# Patient Record
Sex: Female | Born: 1975 | Race: Asian | Hispanic: No | Marital: Married | Smoking: Never smoker
Health system: Southern US, Community
[De-identification: ages and names within clinical notes are randomized; demographics above are authoritative.]

## PROBLEM LIST (undated history)

## (undated) DIAGNOSIS — R21 Rash and other nonspecific skin eruption: Secondary | ICD-10-CM

## (undated) DIAGNOSIS — T783XXA Angioneurotic edema, initial encounter: Secondary | ICD-10-CM

## (undated) HISTORY — PX: WISDOM TOOTH EXTRACTION: SHX21

## (undated) HISTORY — DX: Angioneurotic edema, initial encounter: T78.3XXA

## (undated) HISTORY — DX: Rash and other nonspecific skin eruption: R21

## (undated) HISTORY — PX: OTHER SURGICAL HISTORY: SHX169

---

## 2006-11-15 ENCOUNTER — Ambulatory Visit (HOSPITAL_COMMUNITY): Admission: RE | Admit: 2006-11-15 | Discharge: 2006-11-15 | Payer: Self-pay | Admitting: Obstetrics and Gynecology

## 2010-10-05 ENCOUNTER — Other Ambulatory Visit: Payer: Self-pay | Admitting: Obstetrics and Gynecology

## 2010-10-05 ENCOUNTER — Other Ambulatory Visit (HOSPITAL_COMMUNITY)
Admission: RE | Admit: 2010-10-05 | Discharge: 2010-10-05 | Disposition: A | Discharge status: Home / Self Care | Payer: 59 | Source: Ambulatory Visit | Attending: Obstetrics and Gynecology | Admitting: Obstetrics and Gynecology

## 2010-10-05 DIAGNOSIS — Z01419 Encounter for gynecological examination (general) (routine) without abnormal findings: Secondary | ICD-10-CM | POA: Insufficient documentation

## 2010-10-05 DIAGNOSIS — Z1159 Encounter for screening for other viral diseases: Secondary | ICD-10-CM | POA: Insufficient documentation

## 2011-11-02 ENCOUNTER — Other Ambulatory Visit (HOSPITAL_COMMUNITY)
Admission: RE | Admit: 2011-11-02 | Discharge: 2011-11-02 | Disposition: A | Discharge status: Home / Self Care | Payer: BC Managed Care – PPO | Source: Ambulatory Visit | Attending: Obstetrics and Gynecology | Admitting: Obstetrics and Gynecology

## 2011-11-02 ENCOUNTER — Other Ambulatory Visit: Payer: Self-pay | Admitting: Obstetrics and Gynecology

## 2011-11-02 DIAGNOSIS — Z124 Encounter for screening for malignant neoplasm of cervix: Secondary | ICD-10-CM | POA: Insufficient documentation

## 2017-07-27 ENCOUNTER — Ambulatory Visit: Payer: BLUE CROSS/BLUE SHIELD | Admitting: Allergy and Immunology

## 2017-07-27 ENCOUNTER — Encounter: Payer: Self-pay | Admitting: Allergy and Immunology

## 2017-07-27 VITALS — BP 120/84 | HR 72 | Temp 98.0°F | Resp 16 | Ht 61.0 in | Wt 136.5 lb

## 2017-07-27 DIAGNOSIS — H1013 Acute atopic conjunctivitis, bilateral: Secondary | ICD-10-CM | POA: Diagnosis not present

## 2017-07-27 DIAGNOSIS — J3089 Other allergic rhinitis: Secondary | ICD-10-CM

## 2017-07-27 DIAGNOSIS — H1010 Acute atopic conjunctivitis, unspecified eye: Secondary | ICD-10-CM | POA: Insufficient documentation

## 2017-07-27 DIAGNOSIS — T783XXD Angioneurotic edema, subsequent encounter: Secondary | ICD-10-CM

## 2017-07-27 DIAGNOSIS — T783XXA Angioneurotic edema, initial encounter: Secondary | ICD-10-CM | POA: Insufficient documentation

## 2017-07-27 DIAGNOSIS — L500 Allergic urticaria: Secondary | ICD-10-CM | POA: Diagnosis not present

## 2017-07-27 MED ORDER — LEVOCETIRIZINE DIHYDROCHLORIDE 5 MG PO TABS: 5.0000 mg | ORAL_TABLET | Freq: Every evening | ORAL | 5 refills | Status: AC

## 2017-07-27 MED ORDER — OLOPATADINE HCL 0.7 % OP SOLN: 1.0000 [drp] | Freq: Every day | OPHTHALMIC | 5 refills | Status: DC

## 2017-07-27 MED ORDER — OLOPATADINE HCL 0.2 % OP SOLN: 1.0000 [drp] | OPHTHALMIC | 5 refills | Status: AC

## 2017-07-27 MED ORDER — AZELASTINE HCL 0.15 % NA SOLN: 2.0000 | Freq: Two times a day (BID) | NASAL | 5 refills | Status: AC

## 2017-07-27 NOTE — Assessment & Plan Note (Addendum)
Unclear etiology.  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  Food allergen skin testing was negative today despite a positive histamine control with the exception of borderline positive results to tree nut, though she is able to consume tree nuts without symptoms and her recent symptoms do not correlate with tree nut consumption.  Therefore, these likey represent false positive results which is not uncommon for food allergy testing. The patient is not taking an ACE inhibitor and has no known family history of angioedema.. NSAIDs may exacerbate angioedema but in this case are not the underlying etiology as demonstrated by the fact that the patient has experienced angioedema in the absence of NSAIDs. There are no concomitant symptoms concerning for anaphylaxis or constitutional symptoms worrisome for an underlying malignancy. We will order labs to rule out hereditary angioedema, acquired angioedema, urticaria associated angioedema, and other potential etiologies. For symptom relief, patient is to take oral antihistamines as directed. However, if the underlying pathophysiology is bradykinin mediated, antihistamines will not reduce symptoms.  The following labs have been ordered: Antithyroglobulin antibody, thyroid peroxidase antibody, FCeRI antibody, tryptase, C4, C1 esterase inhibitor (quantitative and functional), C1q, factor XII, CBC, CMP, and galactose-alpha-1,3-galactose IgE level.  The patient will be notified with further recommendations after lab results have returned.  Should symptoms recur, a  journal is to be kept recording any foods eaten, beverages consumed, medications taken, activities performed, and environmental conditions within a 6 hour period prior to the onset of symptoms. For any symptoms concerning for anaphylaxis, 911 is to be called immediately.

## 2017-07-27 NOTE — Assessment & Plan Note (Signed)
   Treatment plan as outlined above for allergic rhinitis.  A prescription has been provided for Pazeo, one drop per eye daily as needed.  I have also recommended eye lubricant drops (i.e., Natural Tears) as needed. 

## 2017-07-27 NOTE — Patient Instructions (Addendum)
Angioedema Unclear etiology.  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  Food allergen skin testing was negative today despite a positive histamine control with the exception of borderline positive results to tree nut, though she is able to consume tree nuts without symptoms and her recent symptoms do not correlate with tree nut consumption.  Therefore, these likey represent false positive results which is not uncommon for food allergy testing. The patient is not taking an ACE inhibitor and has no known family history of angioedema.. NSAIDs may exacerbate angioedema but in this case are not the underlying etiology as demonstrated by the fact that the patient has experienced angioedema in the absence of NSAIDs. There are no concomitant symptoms concerning for anaphylaxis or constitutional symptoms worrisome for an underlying malignancy. We will order labs to rule out hereditary angioedema, acquired angioedema, urticaria associated angioedema, and other potential etiologies. For symptom relief, patient is to take oral antihistamines as directed. However, if the underlying pathophysiology is bradykinin mediated, antihistamines will not reduce symptoms.  The following labs have been ordered: Antithyroglobulin antibody, thyroid peroxidase antibody, FCeRI antibody, tryptase, C4, C1 esterase inhibitor (quantitative and functional), C1q, factor XII, CBC, CMP, and galactose-alpha-1,3-galactose IgE level.  The patient will be notified with further recommendations after lab results have returned.  Should symptoms recur, a  journal is to be kept recording any foods eaten, beverages consumed, medications taken, activities performed, and environmental conditions within a 6 hour period prior to the onset of symptoms. For any symptoms concerning for anaphylaxis, 911 is to be called immediately.  Seasonal  allergic rhinitis  Aeroallergen avoidance measures have been discussed and provided in written form.  A prescription has been provided for levocetirizine, 5 mg daily as needed.  A prescription has been provided for azelastine nasal spray, one spray per nostril 1-2 times daily as needed. Proper nasal spray technique has been discussed and demonstrated.  Nasal saline spray (i.e., Simply Saline) or nasal saline lavage (i.e., NeilMed) is recommended as needed and prior to medicated nasal sprays.  If allergen avoidance measures and medications fail to adequately relieve symptoms, aeroallergen immunotherapy will be considered.  Allergic conjunctivitis  Treatment plan as outlined above for allergic rhinitis.  A prescription has been provided for Pazeo, one drop per eye daily as needed.  I have also recommended eye lubricant drops (i.e., Natural Tears) as needed.   When lab results have returned the patient will be called with further recommendations and follow up instructions.  Reducing Pollen Exposure  The [employer] Academy of [employer] suggests the following steps to reduce your exposure to pollen during allergy seasons.    1. Do not hang sheets or clothing out to dry; pollen may collect on these items. 2. Do not mow lawns or spend time around freshly cut grass; mowing stirs up pollen. 3. Keep windows closed at night.  Keep car windows closed while driving. 4. Minimize morning activities outdoors, a time when pollen counts are usually at their highest. 5. Stay indoors as much as possible when pollen counts or humidity is high and on windy days when pollen tends to remain in the air longer. 6. Use air conditioning when possible.  Many air conditioners have filters that trap the pollen spores. 7. Use a HEPA room air filter to remove pollen form the indoor air you breathe.

## 2017-07-27 NOTE — Progress Notes (Signed)
New Patient Note  RE: Yolanda Elliott MRN: [id number] DOB: 1975-06-06 Date of Office Visit: 07/27/2017  Referring provider: Cipriano Bunker, MD Primary care provider: Cipriano Bunker, MD  Chief Complaint: Angioedema and Rash   History of present illness: Yolanda Elliott is a 42 y.o. female seen today in consultation requested by Cipriano Bunker, MD.  She reports that over the past month she has had recurrent episodes of swelling of her face, particularly around the eyes.  These episodes occur approximately every 10 days and the swelling lasts for approximately 7 days.  She denies concomitant urticaria, cardiopulmonary symptoms, or GI symptoms.  No specific medication, food, skin care product, detergent, soap, or other environmental triggers have been identified.  She has no known family history of angioedema and has not been on an ACE inhibitor.  She went to the urgent care on one occasion and was treated with a corticosteroid injection and prednisone taper, however the systemic steroid caused her to feel dizzy, depressed, with increased hunger and elevated blood pressure.  She reports that she was evaluated by dermatologist in February and diagnosed with rosacea at that time. She experiences nasal congestion, rhinorrhea, sneezing, nasal pruritus, and ocular pruritus.  The symptoms are most prominent during the springtime.  Assessment and plan: Angioedema Unclear etiology.  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  Food allergen skin testing was negative today despite a positive histamine control with the exception of borderline positive results to tree nut, though she is able to consume tree nuts without symptoms and her recent symptoms do not correlate with tree nut consumption.  Therefore, these likey represent false positive results which is not uncommon for food allergy testing. The  patient is not taking an ACE inhibitor and has no known family history of angioedema.. NSAIDs may exacerbate angioedema but in this case are not the underlying etiology as demonstrated by the fact that the patient has experienced angioedema in the absence of NSAIDs. There are no concomitant symptoms concerning for anaphylaxis or constitutional symptoms worrisome for an underlying malignancy. We will order labs to rule out hereditary angioedema, acquired angioedema, urticaria associated angioedema, and other potential etiologies. For symptom relief, patient is to take oral antihistamines as directed. However, if the underlying pathophysiology is bradykinin mediated, antihistamines will not reduce symptoms.  The following labs have been ordered: Antithyroglobulin antibody, thyroid peroxidase antibody, FCeRI antibody, tryptase, C4, C1 esterase inhibitor (quantitative and functional), C1q, factor XII, CBC, CMP, and galactose-alpha-1,3-galactose IgE level.  The patient will be notified with further recommendations after lab results have returned.  Should symptoms recur, a  journal is to be kept recording any foods eaten, beverages consumed, medications taken, activities performed, and environmental conditions within a 6 hour period prior to the onset of symptoms. For any symptoms concerning for anaphylaxis, 911 is to be called immediately.  Seasonal allergic rhinitis  Aeroallergen avoidance measures have been discussed and provided in written form.  A prescription has been provided for levocetirizine, 5 mg daily as needed.  A prescription has been provided for azelastine nasal spray, one spray per nostril 1-2 times daily as needed. Proper nasal spray technique has been discussed and demonstrated.  Nasal saline spray (i.e., Simply Saline) or nasal saline lavage (i.e., NeilMed) is recommended as needed and prior to medicated nasal sprays.  If allergen avoidance measures and medications fail to adequately  relieve symptoms, aeroallergen immunotherapy  will be considered.  Allergic conjunctivitis  Treatment plan as outlined above for allergic rhinitis.  A prescription has been provided for Pazeo, one drop per eye daily as needed.  I have also recommended eye lubricant drops (i.e., Natural Tears) as needed.   Meds ordered this encounter  Medications  . levocetirizine (XYZAL) 5 MG tablet    Sig: Take 1 tablet (5 mg total) by mouth every evening.    Dispense:  30 tablet    Refill:  5  . DISCONTD: Olopatadine HCl (PAZEO) 0.7 % SOLN    Sig: Place 1 drop into both eyes daily.    Dispense:  1 Bottle    Refill:  5  . Olopatadine HCl (PATADAY) 0.2 % SOLN    Sig: Place 1 drop into both eyes 1 day or 1 dose.    Dispense:  1 Bottle    Refill:  5  . Azelastine HCl 0.15 % SOLN    Sig: Place 2 sprays into both nostrils 2 (two) times daily.    Dispense:  30 mL    Refill:  5    Diagnostics: Epicutaneous testing: Robust reactivity to grass pollen, tree pollen, weed pollen, and ragweed pollen. Intradermal testing: Negative. Food allergen skin testing: Borderline positive to almond and hazelnut, though she is able to consume tree nuts on a regular basis without adverse symptoms.  Therefore, these most likely represent false positive results.    Physical examination: Blood pressure 120/84, pulse 72, temperature 98 F (36.7 C), temperature source Oral, resp. rate 16, height  (1.549 m), weight 136 lb 7.4 oz (61.9 kg), SpO2 98 %.  General: Alert, interactive, in no acute distress. HEENT: TMs pearly gray, turbinates moderately edematous with thick discharge, post-pharynx moderately erythematous. Neck: Supple without lymphadenopathy. Lungs: Clear to auscultation without wheezing, rhonchi or rales. CV: Normal S1, S2 without murmurs. Abdomen: Nondistended, nontender. Skin: Warm and dry, without lesions or rashes. Extremities:  No clubbing, cyanosis or edema. Neuro:   Grossly intact.  Review  of systems:  Review of systems negative except as noted in HPI / PMHx or noted below: Review of Systems  Constitutional: Negative.   HENT: Negative.   Eyes: Negative.   Respiratory: Negative.   Cardiovascular: Negative.   Gastrointestinal: Negative.   Genitourinary: Negative.   Musculoskeletal: Negative.   Skin: Negative.   Neurological: Negative.   Endo/Heme/Allergies: Negative.   Psychiatric/Behavioral: Negative.     Past medical history:  Past Medical History:  Diagnosis Date  . Angio-edema   . Rash     Past surgical history:  Past Surgical History:  Procedure Laterality Date  . CESAREAN SECTION  2004,2009,2011  . uterine ablation    . WISDOM TOOTH EXTRACTION      Family history: Family History  Problem Relation Age of Onset  . Allergic rhinitis Father   . Food Allergy Son   . Eczema Son   . Allergic rhinitis Daughter   . Angioedema Neg Hx   . Asthma Neg Hx   . Immunodeficiency Neg Hx   . Urticaria Neg Hx     Social history: Social History   Socioeconomic History  . Marital status: Married    Spouse name: Not on file  . Number of children: Not on file  . Years of education: Not on file  . Highest education level: Not on file  Occupational History  . Not on file  Social Needs  . Financial resource strain: Not on file  . Food insecurity:  Worry: Not on file    Inability: Not on file  . Transportation needs:    Medical: Not on file    Non-medical: Not on file  Tobacco Use  . Smoking status: Never Smoker  . Smokeless tobacco: Never Used  Substance and Sexual Activity  . Alcohol use: Never    Frequency: Never  . Drug use: Never  . Sexual activity: Not on file  Lifestyle  . Physical activity:    Days per week: Not on file    Minutes per session: Not on file  . Stress: Not on file  Relationships  . Social connections:    Talks on phone: Not on file    Gets together: Not on file    Attends religious service: Not on file    Active member  of club or organization: Not on file    Attends meetings of clubs or organizations: Not on file    Relationship status: Not on file  . Intimate partner violence:    Fear of current or ex partner: Not on file    Emotionally abused: Not on file    Physically abused: Not on file    Forced sexual activity: Not on file  Other Topics Concern  . Not on file  Social History Narrative  . Not on file   Environmental History: The patient lives in a house with carpeting the bedroom, gas heat, and central air.  There is a dog in the home which has access to her bedroom.  There is no known mold/water damage in the home.  She is a non-smoker.  Allergies as of 07/27/2017      Reactions   Prednisone    Depression,dizzy and increased blood pressure      Medication List        Accurate as of 07/27/17  1:27 PM. Always use your most recent med list.          Azelastine HCl 0.15 % Soln Place 2 sprays into both nostrils 2 (two) times daily.   levocetirizine 5 MG tablet Commonly known as:  XYZAL Take 1 tablet (5 mg total) by mouth every evening.   Olopatadine HCl 0.2 % Soln Commonly known as:  PATADAY Place 1 drop into both eyes 1 day or 1 dose.       Known medication allergies: Allergies  Allergen Reactions  . Prednisone     Depression,dizzy and increased blood pressure    I appreciate the opportunity to take part in Yolanda Elliott's care. Please do not hesitate to contact me with questions.  Sincerely,   R. Jorene Guest, MD

## 2017-07-27 NOTE — Assessment & Plan Note (Addendum)
   Aeroallergen avoidance measures have been discussed and provided in written form.  A prescription has been provided for levocetirizine, 5 mg daily as needed.  A prescription has been provided for azelastine nasal spray, one spray per nostril 1-2 times daily as needed. Proper nasal spray technique has been discussed and demonstrated.  Nasal saline spray (i.e., Simply Saline) or nasal saline lavage (i.e., NeilMed) is recommended as needed and prior to medicated nasal sprays.  If allergen avoidance measures and medications fail to adequately relieve symptoms, aeroallergen immunotherapy will be considered.

## 2017-08-03 LAB — CBC WITH DIFFERENTIAL/PLATELET
Basophils Absolute: 0 10*3/uL (ref 0.0–0.2)
Basos: 1 %
EOS (ABSOLUTE): 0.1 10*3/uL (ref 0.0–0.4)
Eos: 2 %
Hematocrit: 43.3 % (ref 34.0–46.6)
Hemoglobin: 14.9 g/dL (ref 11.1–15.9)
Immature Grans (Abs): 0 10*3/uL (ref 0.0–0.1)
Immature Granulocytes: 0 %
Lymphocytes Absolute: 2.3 10*3/uL (ref 0.7–3.1)
Lymphs: 42 %
MCH: 30.6 pg (ref 26.6–33.0)
MCHC: 34.4 g/dL (ref 31.5–35.7)
MCV: 89 fL (ref 79–97)
Monocytes Absolute: 0.5 10*3/uL (ref 0.1–0.9)
Monocytes: 8 %
Neutrophils Absolute: 2.7 10*3/uL (ref 1.4–7.0)
Neutrophils: 47 %
Platelets: 256 10*3/uL (ref 150–379)
RBC: 4.87 x10E6/uL (ref 3.77–5.28)
RDW: 13.5 % (ref 12.3–15.4)
WBC: 5.6 10*3/uL (ref 3.4–10.8)

## 2017-08-03 LAB — ALPHA-GAL PANEL
Alpha Gal IgE*: <0.1 kU/L (ref <0.10)
Beef (Bos spp) IgE: <0.1 kU/L (ref <0.35)
Class Interpretation: 0
Class Interpretation: 0
Class Interpretation: 0
Lamb/Mutton (Ovis spp) IgE: <0.1 kU/L (ref <0.35)
Pork (Sus spp) IgE: <0.1 kU/L (ref <0.35)

## 2017-08-03 LAB — COMPREHENSIVE METABOLIC PANEL
ALT: 19 IU/L (ref 0–32)
AST: 19 IU/L (ref 0–40)
Albumin/Globulin Ratio: 1.8 (ref 1.2–2.2)
Albumin: 4.9 g/dL (ref 3.5–5.5)
Alkaline Phosphatase: 64 IU/L (ref 39–117)
BUN/Creatinine Ratio: 16 (ref 9–23)
BUN: 11 mg/dL (ref 6–24)
Bilirubin Total: 0.5 mg/dL (ref 0.0–1.2)
CO2: 23 mmol/L (ref 20–29)
Calcium: 9.6 mg/dL (ref 8.7–10.2)
Chloride: 101 mmol/L (ref 96–106)
Creatinine, Ser: 0.69 mg/dL (ref 0.57–1.00)
GFR calc Af Amer: 124 mL/min/{1.73_m2} (ref >59)
GFR calc non Af Amer: 108 mL/min/{1.73_m2} (ref >59)
Globulin, Total: 2.8 g/dL (ref 1.5–4.5)
Glucose: 91 mg/dL (ref 65–99)
Potassium: 4 mmol/L (ref 3.5–5.2)
Sodium: 142 mmol/L (ref 134–144)
Total Protein: 7.7 g/dL (ref 6.0–8.5)

## 2017-08-03 LAB — CHRONIC URTICARIA: cu index: <5.4 (ref <10)

## 2017-08-03 LAB — THYROGLOBULIN ANTIBODY: Thyroglobulin Antibody: <1 IU/mL (ref 0.0–0.9)

## 2017-08-03 LAB — C1 ESTERASE INHIBITOR, FUNCTIONAL: C1INH Functional/C1INH Total MFr SerPl: 89 %mean normal

## 2017-08-03 LAB — C4 COMPLEMENT: Complement C4, Serum: 28 mg/dL (ref 14–44)

## 2017-08-03 LAB — COMPLEMENT COMPONENT C1Q: Complement C1Q: 22 mg/dL (ref 11.8–24.4)

## 2017-08-03 LAB — FACTOR 12 ASSAY: Factor XII Activity: 106 % (ref 50–150)

## 2017-08-03 LAB — THYROID PEROXIDASE ANTIBODY: Thyroperoxidase Ab SerPl-aCnc: <6 IU/mL (ref 0–34)

## 2017-08-03 LAB — TRYPTASE: Tryptase: <1 ug/L — ABNORMAL LOW (ref 2.2–13.2)
